# Patient Record
Sex: Female | Born: 1970 | Race: White | Hispanic: No | Marital: Married | Smoking: Never smoker
Health system: Southern US, Community
[De-identification: ages and names within clinical notes are randomized; demographics above are authoritative.]

## PROBLEM LIST (undated history)

## (undated) DIAGNOSIS — M549 Dorsalgia, unspecified: Secondary | ICD-10-CM

## (undated) DIAGNOSIS — M51369 Other intervertebral disc degeneration, lumbar region without mention of lumbar back pain or lower extremity pain: Secondary | ICD-10-CM

## (undated) DIAGNOSIS — R0789 Other chest pain: Secondary | ICD-10-CM

## (undated) DIAGNOSIS — M5136 Other intervertebral disc degeneration, lumbar region: Secondary | ICD-10-CM

## (undated) HISTORY — PX: TONSILLECTOMY: SUR1361

## (undated) HISTORY — PX: APPENDECTOMY: SHX54

## (undated) HISTORY — DX: Other intervertebral disc degeneration, lumbar region: M51.36

## (undated) HISTORY — DX: Other chest pain: R07.89

## (undated) HISTORY — DX: Other intervertebral disc degeneration, lumbar region without mention of lumbar back pain or lower extremity pain: M51.369

---

## 1998-02-15 ENCOUNTER — Other Ambulatory Visit: Admission: RE | Admit: 1998-02-15 | Discharge: 1998-02-15 | Payer: Self-pay | Admitting: Obstetrics and Gynecology

## 1999-05-07 ENCOUNTER — Inpatient Hospital Stay (HOSPITAL_COMMUNITY): Admission: AD | Admit: 1999-05-07 | Discharge: 1999-05-07 | Payer: Self-pay | Admitting: *Deleted

## 1999-05-19 ENCOUNTER — Inpatient Hospital Stay (HOSPITAL_COMMUNITY): Admission: AD | Admit: 1999-05-19 | Discharge: 1999-05-19 | Payer: Self-pay | Admitting: Obstetrics and Gynecology

## 1999-05-19 ENCOUNTER — Observation Stay (HOSPITAL_COMMUNITY): Admission: AD | Admit: 1999-05-19 | Discharge: 1999-05-20 | Payer: Self-pay | Admitting: Obstetrics and Gynecology

## 1999-05-28 ENCOUNTER — Inpatient Hospital Stay (HOSPITAL_COMMUNITY): Admission: AD | Admit: 1999-05-28 | Discharge: 1999-05-30 | Payer: Self-pay | Admitting: Obstetrics and Gynecology

## 1999-05-31 ENCOUNTER — Encounter: Admission: RE | Admit: 1999-05-31 | Discharge: 1999-08-29 | Payer: Self-pay | Admitting: *Deleted

## 2000-07-07 ENCOUNTER — Other Ambulatory Visit: Admission: RE | Admit: 2000-07-07 | Discharge: 2000-07-07 | Payer: Self-pay | Admitting: *Deleted

## 2001-02-23 ENCOUNTER — Other Ambulatory Visit: Admission: RE | Admit: 2001-02-23 | Discharge: 2001-02-23 | Payer: Self-pay | Admitting: Obstetrics and Gynecology

## 2001-09-29 ENCOUNTER — Inpatient Hospital Stay (HOSPITAL_COMMUNITY): Admission: AD | Admit: 2001-09-29 | Discharge: 2001-10-01 | Payer: Self-pay | Admitting: Obstetrics and Gynecology

## 2001-11-08 ENCOUNTER — Other Ambulatory Visit: Admission: RE | Admit: 2001-11-08 | Discharge: 2001-11-08 | Payer: Self-pay | Admitting: Obstetrics and Gynecology

## 2002-12-15 ENCOUNTER — Other Ambulatory Visit: Admission: RE | Admit: 2002-12-15 | Discharge: 2002-12-15 | Payer: Self-pay | Admitting: Obstetrics and Gynecology

## 2009-09-18 ENCOUNTER — Encounter: Admission: RE | Admit: 2009-09-18 | Discharge: 2009-09-18 | Payer: Self-pay | Admitting: Obstetrics and Gynecology

## 2010-05-24 NOTE — H&P (Signed)
   NAME:  Jordan Quinn, Jordan Quinn                        ACCOUNT NO.:  [id number]   MEDICAL RECORD NO.:  [id number]                   PATIENT TYPE:  INP   LOCATION:  9171                                 FACILITY:  [facility]   PHYSICIAN:  Lenoard Aden, M.D.             DATE OF BIRTH:  21-Nov-1970   DATE OF ADMISSION:  09/29/2001  DATE OF DISCHARGE:                                HISTORY & PHYSICAL   CHIEF COMPLAINT:  History of precipitous labor.   HISTORY OF PRESENT ILLNESS:  The patient is a 40 year old white female G5,  P3, EDD October 01, 2001 with a history of precipitous labor.   ALLERGIES:  No known drug allergies.   MEDICATIONS:  Prenatal vitamins.   PAST MEDICAL HISTORY:  History of three spontaneous vaginal deliveries 1996,  1997, and 2001.  History of SAB in August 2002.  Prenatal course  uncomplicated.  History if appendectomy, tonsillectomy, laparoscopy, and  urethral dilatation.   FAMILY HISTORY:  Ovarian cancer, insulin-dependent diabetes, and heart  disease.   PHYSICAL EXAMINATION:  GENERAL:  She is a well-developed, well-nourished  white female in no apparent distress.  HEENT:  Normal.  LUNGS:  Clear.  HEART:  Regular rate and rhythm.  ABDOMEN:  Soft, gravid, and nontender.  Estimated fetal weight 7.5 pounds.  PELVIC:  Cervix is 4 cm, 50%, vertex, -1/-2.  EXTREMITIES:  No cords.  NEUROLOGIC:  Nonfocal.   IMPRESSION:  A 41 week intrauterine pregnancy, history of precipitous labor  with favorable cervix.   PLAN:  AROM induction and attempt at vaginal delivery.                                               Lenoard Aden, M.D.    RJT/MEDQ  D:  09/29/2001  T:  09/29/2001  Job:  [id number]

## 2010-05-24 NOTE — Op Note (Signed)
[facility]  Patient:    Jordan Quinn, Jordan Quinn                     MRN: [id number] Proc. Date: 05/19/99 Adm. Date:  [id number] Disc. Date: [id number] Attending:  Maxie Better                           Operative Report  PREOPERATIVE DIAGNOSIS:       Acute appendicitis.  POSTOPERATIVE DIAGNOSIS:      Acute appendicitis.  OPERATION:                    Appendectomy.  SURGEON:                      Zigmund Daniel, M.D.  ASSISTANT:  ANESTHESIA:                   Epidural.  ESTIMATED BLOOD LOSS:  DESCRIPTION OF PROCEDURE:     After adequate anesthesia and monitoring and routine preparation and draping of the abdomen, I made a short transverse lateral abdominal incision just over the point of maximum tenderness.  I dissected down through the fat and then cut and split the muscles in the direction of the fibers and got hemostasis with the cautery.  I opened the peritoneum sharply and found a small  amount of cloudy fluid present.  I dissected laterally and felt an inflammed appendix and grasped it with a Babcock clamp and elevated it.  The appendicitis was toward the tip with the base of the appendix appearing normal.  I isolated, clamped, and tied the appendiceal artery using 2-0 Vicryl.  I tied off the appendix with two ties of 2-0 Vicryl, amputated it, and cauterized the exposed mucosa of the stump.  The closure appeared very secure.  I copiously irrigated the local area  with saline solution and removed the irrigant.  Bleeding was not a problem.  I closed the wound with running 2-0 Vicryl in the peritoneum and running #1 PDS in the muscle layers.  I irrigated each layer successfully before closure.  I closed the skin with intracuticular 4-0 Vicryl and Steri-Strips.  She tolerated the operation well. DD:  05/19/99 TD:  05/20/99 Job: [id number]

## 2010-05-24 NOTE — Op Note (Signed)
   NAME:  Jordan Quinn, Jordan Quinn                        ACCOUNT NO.:  [id number]   MEDICAL RECORD NO.:  [id number]                   PATIENT TYPE:  INP   LOCATION:  9134                                 FACILITY:  [facility]   PHYSICIAN:  Lenoard Aden, M.D.             DATE OF BIRTH:  [birth year]   DATE OF PROCEDURE:  09/29/2001  DATE OF DISCHARGE:                                 OPERATIVE REPORT   PREOPERATIVE DIAGNOSIS:  Intense low back pain with inability to tolerate  contractions.   POSTOPERATIVE DIAGNOSIS:  Intense low back pain with inability to tolerate  contractions.   SURGEON:  Lenoard Aden, M.D.   PROCEDURE:  Delivery.   DESCRIPTION OF PROCEDURE:  Outlet vacuum assistance due to intense low back  pain and inability to tolerate contractions.  The patient is offered a  Mityvac assistance.  Risks and benefits of vacuum assistance were discussed.  The patient acknowledges and wishes to proceed.  Risks of cephalohematoma,  scalp laceration and intracranial hemorrhage noted. Mityvac mushroom cup  placed at the fetal vertex, RAO less than 45 degrees, +3 station for one  pull over a second degree laceration.  Full term living female, Apgars 8 and  9, bulb suctioning performed.  The placenta delivered spontaneously intact.  Three vessel cord noted.  Cervix without lacerations.  Vagina without  lacerations.  Second degree midline perineal laceration repaired using 3-0  Rapide in a standard fashion.  Estimated blood loss was 500 cc.  Please note  that the bladder was catheterized and emptied for approximately 200 cc prior  to vacuum assistance.  Mother and baby recovering in good condition.                                                Lenoard Aden, M.D.    RJT/MEDQ  D:  09/29/2001  T:  09/29/2001  Job:  [id number]   cc:   [facility]

## 2010-05-24 NOTE — H&P (Signed)
[facility]  Patient:    Jordan Quinn, Jordan Quinn                     MRN: [id number] Adm. Date:  [id number] Attending:  Lenoard Aden                         History and Physical  CHIEF COMPLAINT:              Active labor.  HISTORY OF PRESENT ILLNESS:   The patient is a 40 year old white female, gravida 3, para 2, EDD of May 31, 1999, at 39+ weeks in active labor.  ALLERGIES:                    No known drug allergies.  MEDICATIONS:                  Prenatal vitamins and iron.  PAST MEDICAL HISTORY:         Remarkable for two uncomplicated vaginal deliveries in 1996 and 1997.  Diagnostic laparoscopy in 1995 for left adnexal peritubal cyst. History of reproductive endocrinology and inadatrophin IUI for pregnancy. History of appendectomy eight days ago.  FAMILY HISTORY:               Quadrupal bypass surgery, varicose veins, insulin-dependent diabetes mellitus, Alzheimers disease, and lung cancer.  PAST SURGICAL HISTORY:        She had urethral dilatation at age 56, T&A at age 26.  PRENATAL LABORATORY DATA:     Blood type A positive, rubella immune, hepatitis  surface antigen negative, HIV nonreactive, GC and Chlamydia negative.  PHYSICAL EXAMINATION:  GENERAL:                      A well-developed, well-nourished white female coping well.  LUNGS:                        Clear.  HEART:                        Regular rate and rhythm.  ABDOMEN:                      Soft, gravid, and nontender.  Steri-stripped abdominal incision from appendectomy noted.  Fundus nontender.  Cervix is 9 to 0 cm, 100%, vertex, and 0 to +1.  Artificial rupture of membranes reveals clear fluid.  EXTREMITIES:                  Reveal no cords.  NEUROLOGICAL:                 Nonfocal.  IMPRESSION:                   1. 39-week intrauterine pregnancy in active labor.                               2. Status post appendectomy.                               3.  Group B Strep positive.  PLAN:                         Intrapartum  prophylaxis, Pitocin augmentation if necessary, anticipate vaginal delivery. DD:  05/28/99 TD:  05/28/99 Job: [id number]

## 2010-09-24 ENCOUNTER — Other Ambulatory Visit: Payer: Self-pay | Admitting: Obstetrics and Gynecology

## 2010-09-24 DIAGNOSIS — Z1231 Encounter for screening mammogram for malignant neoplasm of breast: Secondary | ICD-10-CM

## 2010-10-04 ENCOUNTER — Ambulatory Visit
Admission: RE | Admit: 2010-10-04 | Discharge: 2010-10-04 | Disposition: A | Discharge status: Home / Self Care | Payer: BC Managed Care – PPO | Source: Ambulatory Visit | Attending: Obstetrics and Gynecology | Admitting: Obstetrics and Gynecology

## 2010-10-04 DIAGNOSIS — Z1231 Encounter for screening mammogram for malignant neoplasm of breast: Secondary | ICD-10-CM

## 2011-09-26 ENCOUNTER — Other Ambulatory Visit: Payer: Self-pay | Admitting: Obstetrics and Gynecology

## 2011-09-26 DIAGNOSIS — Z1231 Encounter for screening mammogram for malignant neoplasm of breast: Secondary | ICD-10-CM

## 2011-10-17 ENCOUNTER — Ambulatory Visit
Admission: RE | Admit: 2011-10-17 | Discharge: 2011-10-17 | Disposition: A | Discharge status: Home / Self Care | Payer: Self-pay | Source: Ambulatory Visit | Attending: Obstetrics and Gynecology | Admitting: Obstetrics and Gynecology

## 2011-10-17 DIAGNOSIS — Z1231 Encounter for screening mammogram for malignant neoplasm of breast: Secondary | ICD-10-CM

## 2012-05-22 ENCOUNTER — Emergency Department (HOSPITAL_COMMUNITY)
Admission: EM | Admit: 2012-05-22 | Discharge: 2012-05-23 | Disposition: A | Discharge status: Home / Self Care | Payer: BC Managed Care – PPO | Attending: Emergency Medicine | Admitting: Emergency Medicine

## 2012-05-22 ENCOUNTER — Encounter (HOSPITAL_COMMUNITY): Payer: Self-pay | Admitting: *Deleted

## 2012-05-22 ENCOUNTER — Emergency Department (HOSPITAL_COMMUNITY): Payer: BC Managed Care – PPO

## 2012-05-22 DIAGNOSIS — R061 Stridor: Secondary | ICD-10-CM

## 2012-05-22 DIAGNOSIS — Z79899 Other long term (current) drug therapy: Secondary | ICD-10-CM | POA: Insufficient documentation

## 2012-05-22 HISTORY — DX: Dorsalgia, unspecified: M54.9

## 2012-05-22 MED ORDER — DEXAMETHASONE SODIUM PHOSPHATE 10 MG/ML IJ SOLN
10.0000 mg | Freq: Once | INTRAMUSCULAR | Status: AC
  Administered 2012-05-22: 10 mg via INTRAVENOUS
  Filled 2012-05-22: qty 1

## 2012-05-22 MED ORDER — DIPHENHYDRAMINE HCL 50 MG/ML IJ SOLN
25.0000 mg | Freq: Once | INTRAMUSCULAR | Status: AC
  Administered 2012-05-22: 25 mg via INTRAVENOUS
  Filled 2012-05-22: qty 1

## 2012-05-22 MED ORDER — RACEPINEPHRINE HCL 2.25 % IN NEBU
0.5000 mL | INHALATION_SOLUTION | Freq: Once | RESPIRATORY_TRACT | Status: AC
  Administered 2012-05-22: 0.5 mL via RESPIRATORY_TRACT
  Filled 2012-05-22: qty 0.5

## 2012-05-22 NOTE — ED Provider Notes (Signed)
History     CSN: [id number]  Arrival date & time 05/22/12  2016   First MD Initiated Contact with Patient 05/22/12 2047      Chief Complaint  Patient presents with  . Shortness of Breath    (Consider location/radiation/quality/duration/timing/severity/associated sxs/prior treatment) HPI Comments: Jordan Quinn is a 42 y.o. female presents emergency department complaining of shortness of breath and voice change.  Onset of symptoms began approximately 2 hours ago while patient was resting in bed.  No known precipitating events occurred.  Patient describes as though she feels something is in her throat.  Symptoms have been gradually improving since onset. Patient has history of amoxicillin urticarial reaction but no other known allergies.  Patient has not started any new medications or products.  Patient reports that 3 weeks ago she had a concussion from a fly ball at a baseball game and wonders if this may be the cause.  Approximately one week ago patient had a similar occurrence, however this time it only involved the voice change without difficulty breathing.  No known fevers, cough, night sweats, chills, chest pain.  Patient is a 42 y.o. female presenting with shortness of breath. The history is provided by the patient.  Shortness of Breath   Past Medical History  Diagnosis Date  . Back pain     Past Surgical History  Procedure Laterality Date  . Appendectomy    . Tonsillectomy      History reviewed. No pertinent family history.  History  Substance Use Topics  . Smoking status: Not on file  . Smokeless tobacco: Not on file  . Alcohol Use: No    OB History   Grav Para Term Preterm Abortions TAB SAB Ect Mult Living                  Review of Systems  Respiratory: Positive for shortness of breath.   All other systems reviewed and are negative.    Allergies  Amoxicillin  Home Medications   Current Outpatient Rx  Name  Route  Sig  Dispense  Refill  .  ibuprofen (ADVIL,MOTRIN) 200 MG tablet   Oral   Take 400 mg by mouth once.         . loratadine (CLARITIN) 10 MG tablet   Oral   Take 10 mg by mouth once.         Marland Kitchen oxyCODONE-acetaminophen (PERCOCET) 10-325 MG per tablet   Oral   Take 0.5 tablets by mouth every 4 (four) hours as needed for pain.           BP 113/58  Pulse 82  Temp(Src) 98.1 F (36.7 C) (Oral)  Resp 18  SpO2 100%  LMP 05/04/2012  Physical Exam  Nursing note and vitals reviewed. Constitutional: She is oriented to person, place, and time. She appears well-developed and well-nourished. She appears distressed.  HENT:  Head: Normocephalic and atraumatic.  MMM, airway intact,  oropharynx clear and moist without exudate.    Eyes: Conjunctivae and EOM are normal.  Neck: Normal range of motion. Neck supple. No JVD present.  No cervical adenopathy  Cardiovascular:  Regular rate rhythm.  Intact distal pulses  Pulmonary/Chest: Effort normal. Stridor present. She has no wheezes. She exhibits no tenderness.  Lungs clear to auscultation bilaterally  Musculoskeletal: Normal range of motion.  Neurological: She is alert and oriented to person, place, and time.  Skin: Skin is warm and dry. No rash noted. She is not diaphoretic.  Psychiatric: She has  a normal mood and affect. Her behavior is normal.    ED Course  Procedures (including critical care time)  Labs Reviewed - No data to display No results found.   No diagnosis found.  Medications  dexamethasone (DECADRON) injection 10 mg (not administered)  diphenhydrAMINE (BENADRYL) injection 25 mg (25 mg Intravenous Given 05/22/12 2145)  Racepinephrine HCl 2.25 % nebulizer solution 0.5 mL (0.5 mLs Nebulization Given 05/22/12 2147)     MDM  Patient to emergency department with acute onset of voice change & stridor or, no known precipitating events. Low suspicion for allergic reaction as pt denies throat closing sensation, is normotensive and presents without  urticaria, angioedema or pruritis.  Normal vital signs throughout hospital stay.  Significant improvement with racemic epinephrine and Benadryl.  Decadron given for longer acting steroid relief for possible inflammation of unknown cause.  Referral to ENT as well as extremely strict return precautions discussed in depth with both patient and wife.  Patient case seen with attending who agrees with disposition plan.        Jaci Carrel, [place] 05/22/12 2329

## 2012-05-22 NOTE — ED Provider Notes (Signed)
Medical screening examination/treatment/procedure(s) were conducted as a shared visit with non-physician practitioner(s) and myself.  I personally evaluated the patient during the encounter  Pt with second occurrence of rather sudden change in voice, today had stridor, mild, no apparent resp distress, no fever.  Soft tissue neck is unremarkable.  Pt improved with bendaryl, racemic epi.  Pt observed in the ED and pt remains significantly improved.  Will put on decadron and refer to ENT for follow up.  Pt understands to return immediately for any worsening symptoms.    Gavin Pound. Oletta Lamas, MD 05/22/12 2324

## 2012-05-22 NOTE — ED Notes (Signed)
Pt states 2 hours ago she was sitting on her and became sob and change in voice,  Hands feel tingling,  States she had a head injury two weeks ago

## 2012-06-02 ENCOUNTER — Other Ambulatory Visit: Payer: Self-pay | Admitting: Physical Medicine and Rehabilitation

## 2012-06-02 DIAGNOSIS — S060X0A Concussion without loss of consciousness, initial encounter: Secondary | ICD-10-CM

## 2012-06-03 ENCOUNTER — Ambulatory Visit
Admission: RE | Admit: 2012-06-03 | Discharge: 2012-06-03 | Disposition: A | Discharge status: Home / Self Care | Payer: BC Managed Care – PPO | Source: Ambulatory Visit | Attending: Physical Medicine and Rehabilitation | Admitting: Physical Medicine and Rehabilitation

## 2012-06-03 DIAGNOSIS — S060X0A Concussion without loss of consciousness, initial encounter: Secondary | ICD-10-CM

## 2012-06-07 ENCOUNTER — Other Ambulatory Visit: Payer: BC Managed Care – PPO

## 2012-06-17 ENCOUNTER — Ambulatory Visit: Payer: BC Managed Care – PPO | Admitting: Neurology

## 2012-06-17 ENCOUNTER — Encounter: Payer: Self-pay | Admitting: Neurology

## 2012-06-18 ENCOUNTER — Encounter: Payer: Self-pay | Admitting: Neurology

## 2012-06-18 ENCOUNTER — Ambulatory Visit (INDEPENDENT_AMBULATORY_CARE_PROVIDER_SITE_OTHER): Payer: BC Managed Care – PPO | Admitting: Neurology

## 2012-06-18 VITALS — BP 102/60 | HR 84 | Ht 64.5 in | Wt 110.0 lb

## 2012-06-18 DIAGNOSIS — S0990XA Unspecified injury of head, initial encounter: Secondary | ICD-10-CM

## 2012-06-18 DIAGNOSIS — G43909 Migraine, unspecified, not intractable, without status migrainosus: Secondary | ICD-10-CM

## 2012-06-18 NOTE — Progress Notes (Signed)
History of present illness:  Jordan Quinn is a 42 years old right-handed Caucasian female, referred by her pain management doctor Remos for evaluation of frequent headaches  She is under Dr. Alger Simons care for chronic upper back, low back pain, she is taking Percocet 10/325 mg half tablets every 4 hours.  She suffered a baseball head injury in April 27th 2014, while watching her son playing baseball play, no loss of consciousness, but did has bruits at her left frontal area   Later days, she complains of intermittent confusion, agitation, bursting into tears, do not know how to handle her excel spread sheet, feeling scared,memory loss,  difficulty carrying on a conversation   In addition she has almost daily headaches, at left retro-orbital area, left occipital region, pressure, pounding, with associated light noise sensitivity, nauseous,   Overall her symptoms has 80% improvement, Percocet helps her headache  She denied previous history of migraine headaches, no consistent visual loss, she reported normal CAT scan of the brain without contrast 2 weeks ago.  Review of Systems  Out of a complete 14 system review, the patient complains of only the following symptoms, and all other reviewed systems are negative.   Constitutional:   N/A Cardiovascular:  N/A Ear/Nose/Throat:  N/A Skin: N/A Eyes: N/A Respiratory: N/A Gastroitestinal: N/A    Hematology/Lymphatic:  N/A Endocrine:  N/A Musculoskeletal:N/A Allergy/Immunology: allergy Neurological: memory loss, confusion, headache, dizziness Psychiatric:    N/A  PHYSICAL EXAMINATOINS:  Generalized: In no acute distress  Neck: Supple, no carotid bruits   Cardiac: Regular rate rhythm  Pulmonary: Clear to auscultation bilaterally  Musculoskeletal: No deformity  Neurological examination  Mentation: Alert oriented to time, place, history taking, and causual conversation  Cranial nerve II-XII: Pupils were equal round reactive to light  extraocular movements were full, visual field were full on confrontational test. facial sensation and strength were normal. hearing was intact to finger rubbing bilaterally. Uvula tongue midline.  head turning and shoulder shrug and were normal and symmetric.Tongue protrusion into cheek strength was normal.  Motor: normal tone, bulk and strength.  Sensory: Intact to fine touch, pinprick, preserved vibratory sensation, and proprioception at toes.  Coordination: Normal finger to nose, heel-to-shin bilaterally there was no truncal ataxia  Gait: Rising up from seated position without assistance, normal stance, without trunk ataxia, moderate stride, good arm swing, smooth turning, able to perform tiptoe, and heel walking without difficulty.   Romberg signs: Negative  Deep tendon reflexes: Brachioradialis 2/2, biceps 2/2, triceps 2/2, patellar 2/2, Achilles 2/2, plantar responses were flexor bilaterally.   Assessment and plan,  42 years old Caucasian female, with history of head trauma induced a mild migraine headaches, normal neurological examination, normal CAT scan of the brain 1. Magnesium oxide, riboflavin as preventive medications, 2. Percocet as needed. 3. RTC with Eber Jones in 3 months

## 2012-09-21 ENCOUNTER — Ambulatory Visit: Payer: BC Managed Care – PPO | Admitting: Nurse Practitioner

## 2012-10-06 ENCOUNTER — Other Ambulatory Visit: Payer: Self-pay

## 2012-10-06 DIAGNOSIS — Z1231 Encounter for screening mammogram for malignant neoplasm of breast: Secondary | ICD-10-CM

## 2012-11-05 ENCOUNTER — Ambulatory Visit
Admission: RE | Admit: 2012-11-05 | Discharge: 2012-11-05 | Disposition: A | Discharge status: Home / Self Care | Payer: BC Managed Care – PPO | Source: Ambulatory Visit

## 2012-11-05 DIAGNOSIS — Z1231 Encounter for screening mammogram for malignant neoplasm of breast: Secondary | ICD-10-CM

## 2014-03-28 IMAGING — CT CT HEAD W/O CM
2 series · 16 of 30 positions shown, 20 images · non-contrast
Comparison: None.

CLINICAL DATA: Headaches, dizziness

CT HEAD WITHOUT CONTRAST
TECHNIQUE: Contiguous axial images were obtained from the base of
the skull through the vertex without contrast.

[Series 2: head w/o · axial · non-contrast · 0.49mm/px · z∈[+39,+167]mm · 13 of 28 slices shown, 17 images]
[im 2/28  brain]
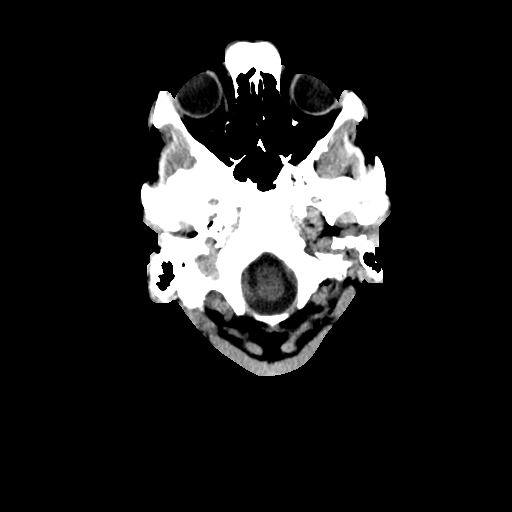
[im 2/28  bone]
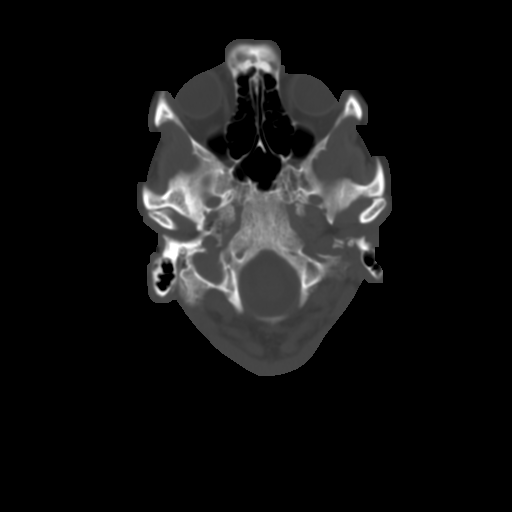
[im 4/28  brain]
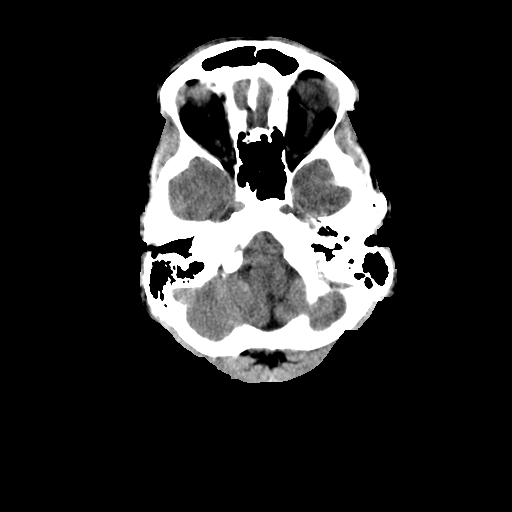
[im 6/28  brain]
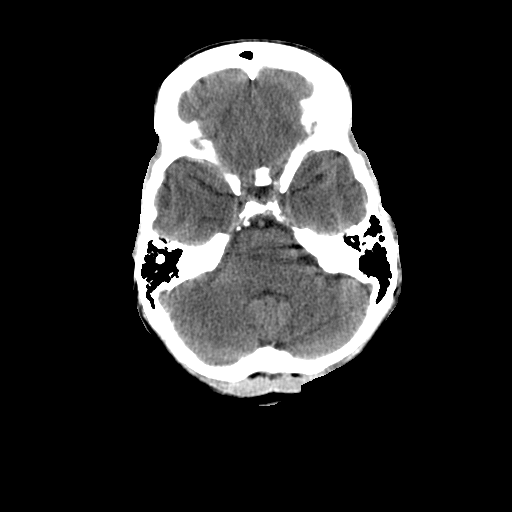
[im 8/28  brain]
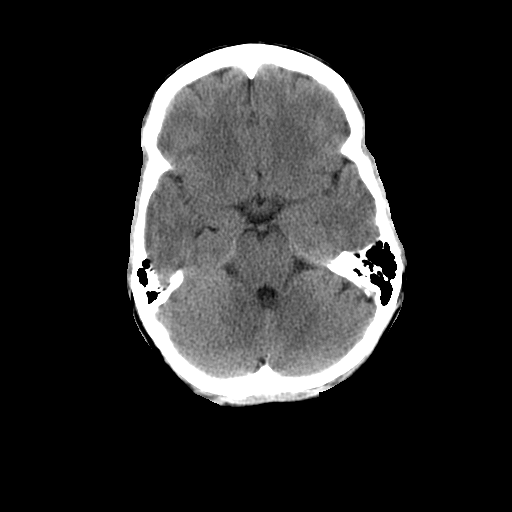
[im 10/28  brain]
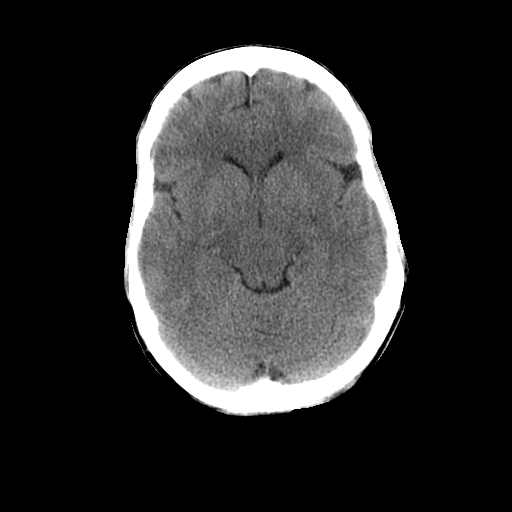
[im 10/28  bone]
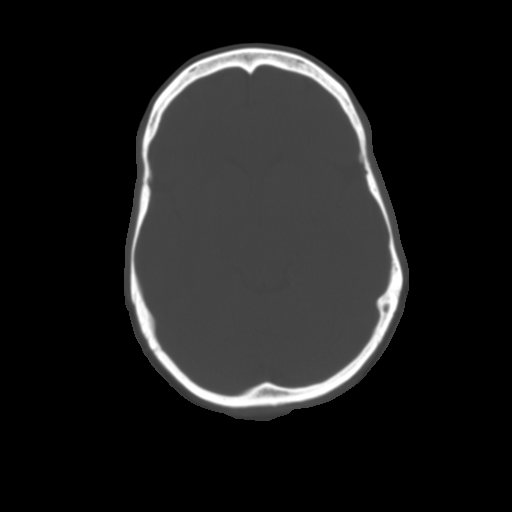
[im 12/28  brain]
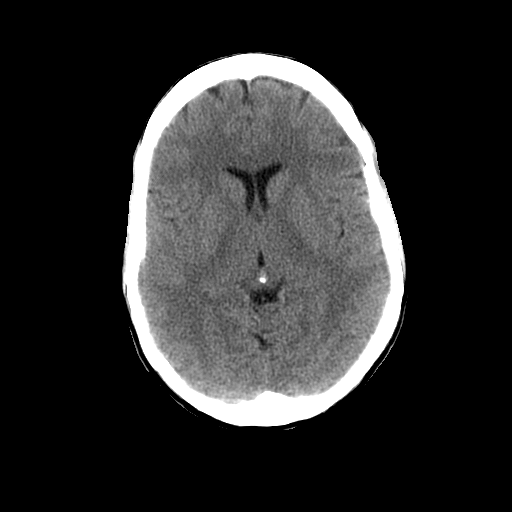
[im 14/28  brain]
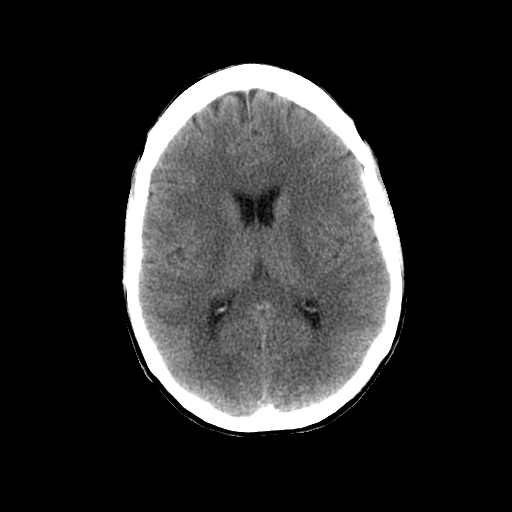
[im 16/28  brain]
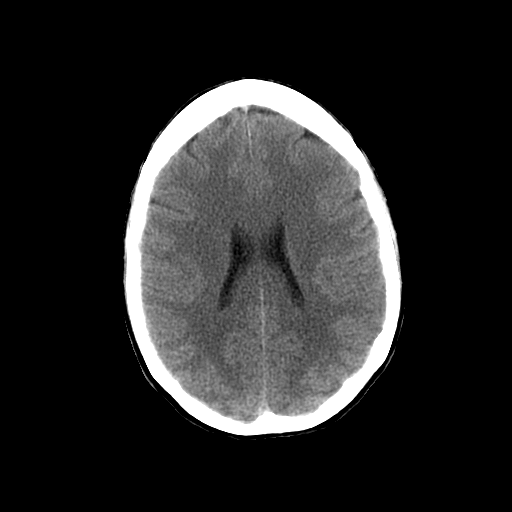
[im 18/28  brain]
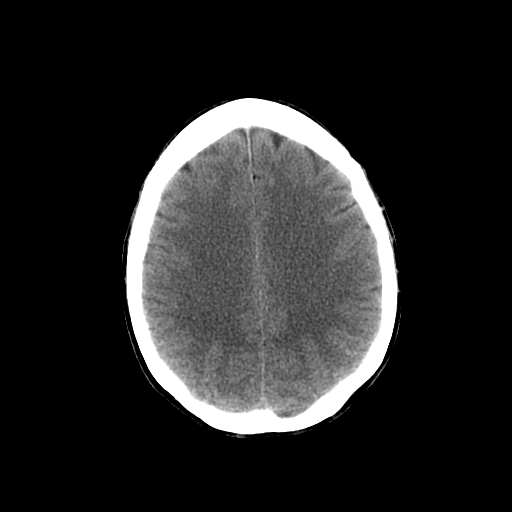
[im 18/28  bone]
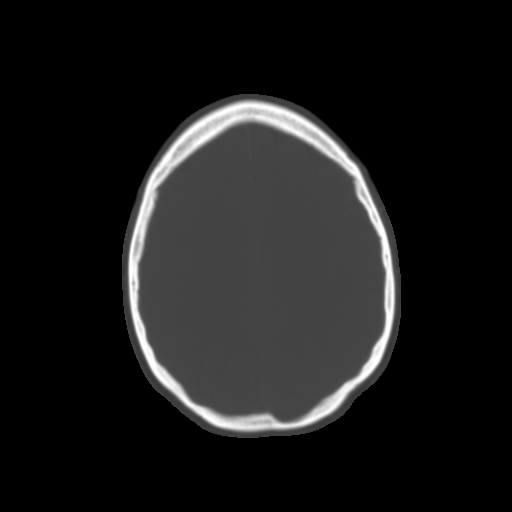
[im 20/28  brain]
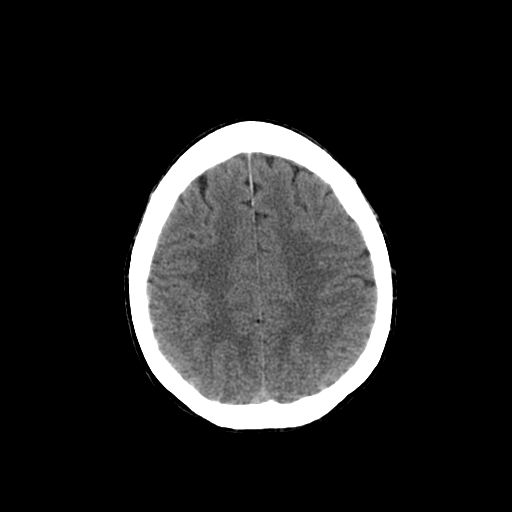
[im 22/28  brain]
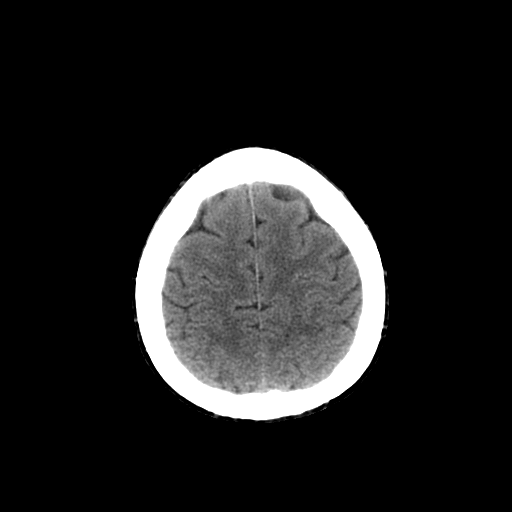
[im 24/28  brain]
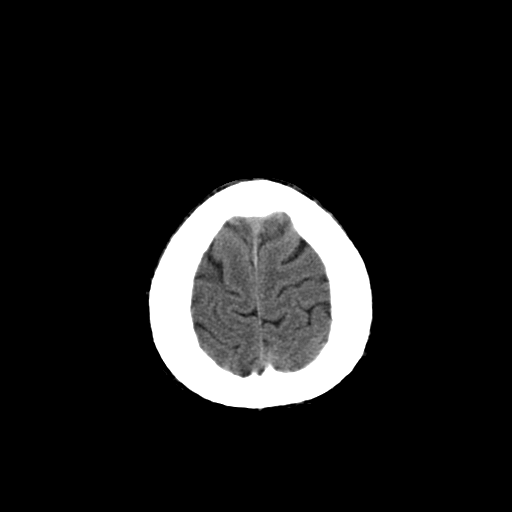
[im 26/28  brain]
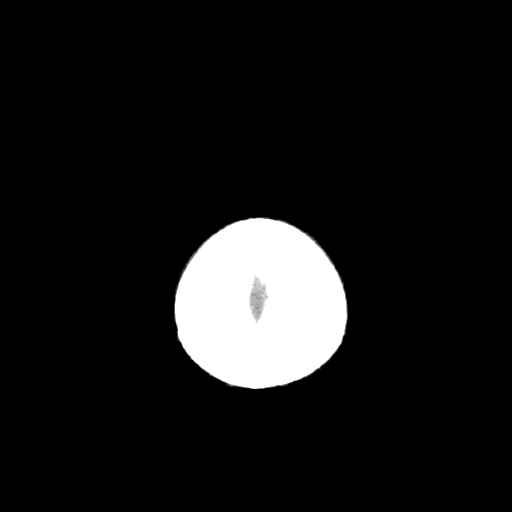
[im 26/28  bone]
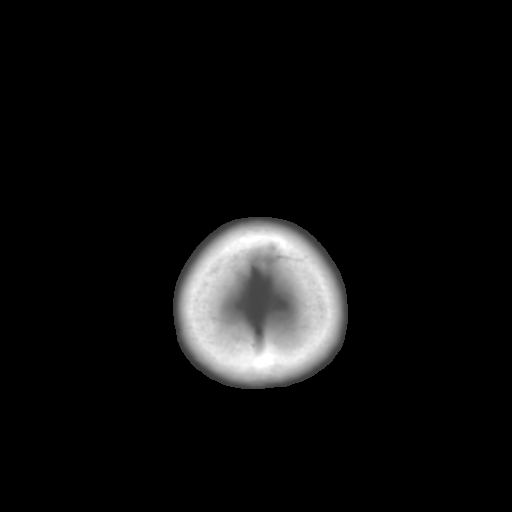

[Series 3: head bone · axial · 0.49mm/px · z∈[+39,+81]mm · 3 of 28 slices shown]
[im 2/28  bone]
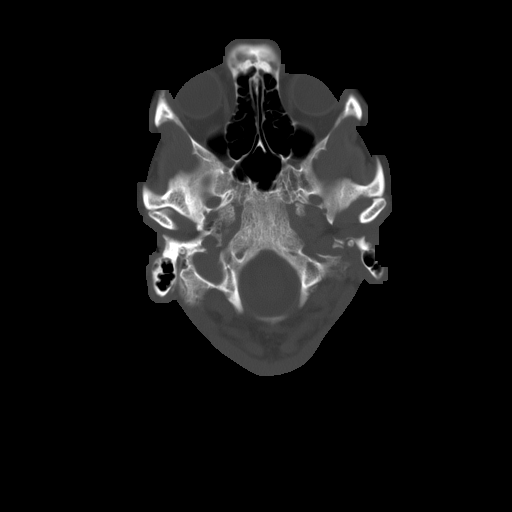
[im 6/28  bone]
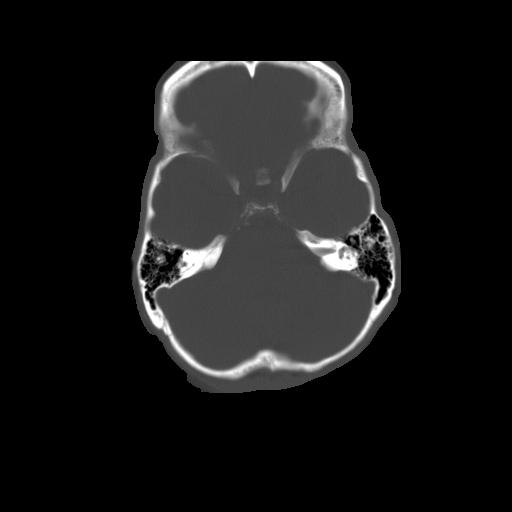
[im 10/28  bone]
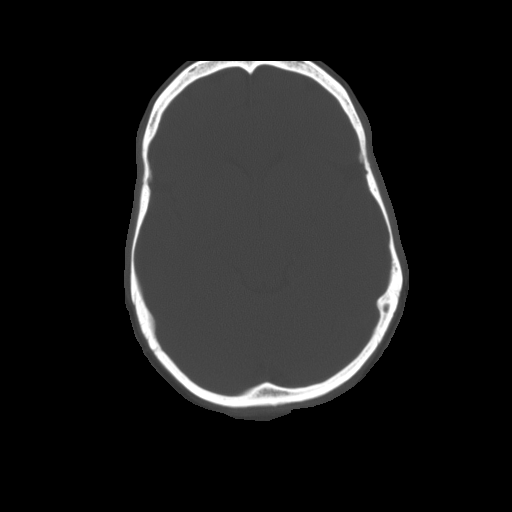

[16 of 30 positions shown; findings below may reference images not displayed]

FINDINGS: There is no evidence of acute intracranial hemorrhage,
brain edema, mass lesion, acute infarction,   mass effect, or
midline shift. Acute infarct may be inapparent on noncontrast CT.
No other intra-axial abnormalities are seen, and the ventricles and
sulci are within normal limits in size and symmetry.   No abnormal
extra-axial fluid collections or masses are identified.  No
significant calvarial abnormality.
IMPRESSION: 1. Negative for bleed or other acute intracranial process.

## 2014-08-25 ENCOUNTER — Emergency Department
Admission: EM | Admit: 2014-08-25 | Discharge: 2014-08-26 | Disposition: A | Discharge status: Home / Self Care | Payer: 59 | Attending: Emergency Medicine | Admitting: Emergency Medicine

## 2014-08-25 ENCOUNTER — Encounter: Payer: Self-pay | Admitting: Emergency Medicine

## 2014-08-25 ENCOUNTER — Other Ambulatory Visit: Payer: Self-pay

## 2014-08-25 ENCOUNTER — Emergency Department: Payer: 59

## 2014-08-25 DIAGNOSIS — Z880 Allergy status to penicillin: Secondary | ICD-10-CM | POA: Diagnosis not present

## 2014-08-25 DIAGNOSIS — R079 Chest pain, unspecified: Secondary | ICD-10-CM | POA: Diagnosis not present

## 2014-08-25 DIAGNOSIS — Z79899 Other long term (current) drug therapy: Secondary | ICD-10-CM | POA: Diagnosis not present

## 2014-08-25 DIAGNOSIS — F419 Anxiety disorder, unspecified: Secondary | ICD-10-CM | POA: Diagnosis not present

## 2014-08-25 LAB — COMPREHENSIVE METABOLIC PANEL
ALT: 16 U/L (ref 14–54)
ANION GAP: 7 (ref 5–15)
AST: 23 U/L (ref 15–41)
Albumin: 4.6 g/dL (ref 3.5–5.0)
Alkaline Phosphatase: 30 U/L — ABNORMAL LOW (ref 38–126)
BILIRUBIN TOTAL: 0.2 mg/dL — AB (ref 0.3–1.2)
BUN: 11 mg/dL (ref 6–20)
CO2: 26 mmol/L (ref 22–32)
Calcium: 9.4 mg/dL (ref 8.9–10.3)
Chloride: 105 mmol/L (ref 101–111)
Creatinine, Ser: 0.63 mg/dL (ref 0.44–1.00)
Glucose, Bld: 103 mg/dL — ABNORMAL HIGH (ref 65–99)
POTASSIUM: 3.4 mmol/L — AB (ref 3.5–5.1)
Sodium: 138 mmol/L (ref 135–145)
TOTAL PROTEIN: 6.9 g/dL (ref 6.5–8.1)

## 2014-08-25 LAB — CBC
HEMATOCRIT: 34.9 % — AB (ref 35.0–47.0)
Hemoglobin: 12 g/dL (ref 12.0–16.0)
MCH: 30.5 pg (ref 26.0–34.0)
MCHC: 34.5 g/dL (ref 32.0–36.0)
MCV: 88.3 fL (ref 80.0–100.0)
Platelets: 233 10*3/uL (ref 150–440)
RBC: 3.95 MIL/uL (ref 3.80–5.20)
RDW: 12.7 % (ref 11.5–14.5)
WBC: 7.9 10*3/uL (ref 3.6–11.0)

## 2014-08-25 LAB — TROPONIN I: Troponin I: <0.03 ng/mL (ref <0.031)

## 2014-08-25 MED ORDER — LORAZEPAM 1 MG PO TABS
ORAL_TABLET | ORAL | Status: AC
  Administered 2014-08-25: 1 mg via ORAL
  Filled 2014-08-25: qty 1

## 2014-08-25 MED ORDER — LORAZEPAM 1 MG PO TABS
1.0000 mg | ORAL_TABLET | Freq: Once | ORAL | Status: AC
  Administered 2014-08-25: 1 mg via ORAL

## 2014-08-25 NOTE — ED Provider Notes (Signed)
[facility] Emergency Department Provider Note  ____________________________________________  Time seen: 10 PM  I have reviewed the triage vital signs and the nursing notes.   HISTORY  Chief Complaint Chest Pain    HPI Jordan Quinn is a 44 y.o. female who presents with complaints of chest pain. This started approximately an hour and a half prior to arrival. She notes that she has been under a lot of stress with her children returning to school and she has a history of anxiety which has caused her to have tightness in her chest in the past. She had tingling in both of her fingers during the event. Currently she feels well and has no chest pain. No fevers or chills. No shortness of breath. No recent travel.She has no history of coronary artery disease     Past Medical History  Diagnosis Date  . Back pain   . Lumbar degenerative disc disease     Patient Active Problem List   Diagnosis Date Noted  . Head trauma 06/18/2012  . Migraine, unspecified, without mention of intractable migraine without mention of status migrainosus 06/18/2012    Past Surgical History  Procedure Laterality Date  . Appendectomy    . Tonsillectomy      Current Outpatient Rx  Name  Route  Sig  Dispense  Refill  . fexofenadine (ALLEGRA) 180 MG tablet   Oral   Take 180 mg by mouth daily.         Marland Kitchen oxyCODONE-acetaminophen (PERCOCET) 10-325 MG per tablet   Oral   Take 0.5 tablets by mouth every 4 (four) hours as needed for pain.         Marland Kitchen zolpidem (AMBIEN) 5 MG tablet   Oral   Take 5 mg by mouth at bedtime as needed for sleep.           Allergies Ibuprofen and Amoxicillin  Family History  Problem Relation Age of Onset  . High Cholesterol Mother   . Hypertension Mother   . Osteoporosis Father   . Cancer - Lung Maternal Grandfather   . Cancer - Ovarian Paternal Grandmother     Social History Social History  Substance Use Topics  . Smoking status: Never  Smoker   . Smokeless tobacco: Never Used  . Alcohol Use: No    Review of Systems  Constitutional: Negative for fever. Eyes: Negative for visual changes. ENT: Negative for sore throat Cardiovascular: Positive for chest pain Respiratory: Negative for shortness of breath. Gastrointestinal: Negative for abdominal pain, vomiting and diarrhea. Genitourinary: Negative for dysuria. Musculoskeletal: Negative for back pain. Skin: Negative for rash. Neurological: Negative for headaches . Positive for dizziness Psychiatric: Mild anxiety    ____________________________________________   PHYSICAL EXAM:  VITAL SIGNS: ED Triage Vitals  Enc Vitals Group     BP 08/25/14 2107 120/71 mmHg     Pulse Rate 08/25/14 2107 69     Resp 08/25/14 2107 20     Temp 08/25/14 2107 98.8 F (37.1 C)     Temp Source 08/25/14 2107 Oral     SpO2 08/25/14 2107 100 %     Weight 08/25/14 2107 110 lb (49.896 kg)     Height 08/25/14 2107  (1.651 m)     Head Cir --      Peak Flow --      Pain Score --      Pain Loc --      Pain Edu? --      Excl.  in GC? --      Constitutional: Alert and oriented. Well appearing and in no distress. Eyes: Conjunctivae are normal.  ENT   Head: Normocephalic and atraumatic.   Mouth/Throat: Mucous membranes are moist. Cardiovascular: Normal rate, regular rhythm. Normal and symmetric distal pulses are present in all extremities. No murmurs, rubs, or gallops. Respiratory: Normal respiratory effort without tachypnea nor retractions. Breath sounds are clear and equal bilaterally.  Gastrointestinal: Soft and non-tender in all quadrants. No distention. There is no CVA tenderness. Genitourinary: deferred Musculoskeletal: Nontender with normal range of motion in all extremities. No lower extremity tenderness nor edema. Neurologic:  Normal speech and language. No gross focal neurologic deficits are appreciated. Skin:  Skin is warm, dry and intact. No rash  noted. Psychiatric: Mood and affect are normal. Patient exhibits appropriate insight and judgment.  ____________________________________________    LABS (pertinent positives/negatives)  Labs Reviewed  CBC - Abnormal; Notable for the following:    HCT 34.9 (*)    All other components within normal limits  COMPREHENSIVE METABOLIC PANEL - Abnormal; Notable for the following:    Potassium 3.4 (*)    Glucose, Bld 103 (*)    Alkaline Phosphatase 30 (*)    Total Bilirubin 0.2 (*)    All other components within normal limits  TROPONIN I  TROPONIN I    ____________________________________________   EKG  ED ECG REPORT I, Jene Every, the attending physician, personally viewed and interpreted this ECG.  Date: 08/25/2014 EKG Time: 9 PM Rate: 75 Rhythm: normal sinus rhythm QRS Axis: normal Intervals: normal ST/T Wave abnormalities: normal Conduction Disutrbances: none Narrative Interpretation: unremarkable   ____________________________________________    RADIOLOGY I have personally reviewed any xrays that were ordered on this patient: Chest x-ray is unremarkable  ____________________________________________   PROCEDURES  Procedure(s) performed: none  Critical Care performed: none  ____________________________________________   INITIAL IMPRESSION / ASSESSMENT AND PLAN / ED COURSE  Pertinent labs & imaging results that were available during my care of the patient were reviewed by me and considered in my medical decision making (see chart for details).  Patient's history of present illness sounds more consistent with anxiety reaction as opposed ACS. Her initial troponin is normal. She is perc negative We will do a second troponin and if normal the patient follow-up with her physician. Patient is calm and interactive now.  ____________________________________________   FINAL CLINICAL IMPRESSION(S) / ED DIAGNOSES  Final diagnoses:  Chest pain, unspecified  chest pain type     Jene Every, MD 08/27/14 0004

## 2014-08-25 NOTE — Discharge Instructions (Signed)

## 2014-08-25 NOTE — ED Notes (Signed)
Pt states onset of left sided chest pain that began approx one hour pta. Pt deniesnausea, states has shob, states had tingling to left fingers. Pt is anxious. Pt denies pain currently. Pt denies diaphoresis.

## 2014-08-26 MED ORDER — LORAZEPAM 0.5 MG PO TABS: 0.5000 mg | ORAL_TABLET | Freq: Three times a day (TID) | ORAL | Status: AC | PRN

## 2014-08-26 NOTE — ED Notes (Signed)
Patient with no complaints at this time. Respirations even and unlabored. Skin warm/dry. Discharge instructions reviewed with patient at this time. Patient given opportunity to voice concerns/ask questions. Patient discharged at this time and left Emergency Department with steady gait.   

## 2014-08-30 ENCOUNTER — Telehealth: Payer: Self-pay

## 2014-08-30 NOTE — Telephone Encounter (Signed)
l mom to schedule ED f/u 

## 2014-09-26 ENCOUNTER — Ambulatory Visit (INDEPENDENT_AMBULATORY_CARE_PROVIDER_SITE_OTHER): Payer: 59 | Admitting: Cardiovascular Disease

## 2014-09-26 ENCOUNTER — Encounter: Payer: Self-pay | Admitting: Cardiovascular Disease

## 2014-09-26 VITALS — BP 98/62 | HR 67 | Ht 65.0 in | Wt 113.4 lb

## 2014-09-26 DIAGNOSIS — R0789 Other chest pain: Secondary | ICD-10-CM | POA: Diagnosis not present

## 2014-09-26 DIAGNOSIS — R079 Chest pain, unspecified: Secondary | ICD-10-CM

## 2014-09-26 NOTE — Patient Instructions (Signed)
Medication Instructions:  Your physician recommends that you continue on your current medications as directed. Please refer to the Current Medication list given to you today.   Labwork: NONE  Testing/Procedures: NONE  Follow-Up: Follow up with Dr. Berry as needed.  Any Other Special Instructions Will Be Listed Below (If Applicable).   

## 2014-09-26 NOTE — Assessment & Plan Note (Signed)
Jordan Quinn is a delightful 44 year old female referred for referred by [facility] emergency room for evaluation of atypical chest pain. He has no cardiovascular risk factors. She is under a lot of stress. Her symptoms sounded more like hyperventilation. Her EKG showed an RV conduction delay but otherwise no acute changes and her enzymes were negative. She exercises on a daily basis. I do not think her pain was cardiovascular in nature. No further workup is required at this time

## 2014-09-26 NOTE — Progress Notes (Signed)
09/26/2014 Jordan Quinn   [birth year]  [id number]  Primary Physician No PCP Per Patient Primary Cardiologist: Runell Gess MD Roseanne Reno   HPI:  Jordan Quinn is a delightful 44 year old thin and fit-appearing married Caucasian female mother of 4 children who does contract work for the [employer] and also is present at the [employer]. She was referred by [facility] emergency room for cardiovascular evaluation because of an episode of atypical chest pain. Her OB/GYN is Dr. Rosemary Holms . She has no cardiac vascular risk factors. She's never had a heart attack or stroke. She denies chest pain or shortness of breath. She exercises on a daily basis. She does admit to being under a lot of stress. She had an episode of dizziness, tingling in her hands and feet atypical chest pain which prompted an ER visit on the way from [facility] where she was seen at [facility]. Workup was negative. She's had no recurrent symptoms.   Current Outpatient Prescriptions  Medication Sig Dispense Refill  . fexofenadine (ALLEGRA) 180 MG tablet Take 180 mg by mouth daily.    Marland Kitchen LORazepam (ATIVAN) 0.5 MG tablet Take 1 tablet (0.5 mg total) by mouth every 8 (eight) hours as needed for anxiety. 20 tablet 0  . oxyCODONE-acetaminophen (PERCOCET) 10-325 MG per tablet Take 0.5 tablets by mouth every 4 (four) hours as needed for pain.    Marland Kitchen zolpidem (AMBIEN) 5 MG tablet Take 5 mg by mouth at bedtime as needed for sleep.     No current facility-administered medications for this visit.    Allergies  Allergen Reactions  . Ibuprofen Shortness Of Breath  . Amoxicillin Hives, Itching and Rash    Social History   Social History  . Marital Status: Married    Spouse Name: N/A  . Number of Children: 4  . Years of Education: 18   Occupational History  . Not on file.   Social History Main Topics  . Smoking status: Never Smoker   . Smokeless tobacco: Never Used  . Alcohol Use: No  . Drug Use: No   . Sexual Activity: Not on file   Other Topics Concern  . Not on file   Social History Narrative     Review of Systems: General: negative for chills, fever, night sweats or weight changes.  Cardiovascular: negative for chest pain, dyspnea on exertion, edema, orthopnea, palpitations, paroxysmal nocturnal dyspnea or shortness of breath Dermatological: negative for rash Respiratory: negative for cough or wheezing Urologic: negative for hematuria Abdominal: negative for nausea, vomiting, diarrhea, bright red blood per rectum, melena, or hematemesis Neurologic: negative for visual changes, syncope, or dizziness All other systems reviewed and are otherwise negative except as noted above.    Blood pressure 98/62, pulse 67, height  (1.651 m), weight 113 lb 6.4 oz (51.438 kg).  General appearance: alert and no distress Neck: no adenopathy, no carotid bruit, no JVD, supple, symmetrical, trachea midline and thyroid not enlarged, symmetric, no tenderness/mass/nodules Lungs: clear to auscultation bilaterally Heart: regular rate and rhythm, S1, S2 normal, no murmur, click, rub or gallop Extremities: extremities normal, atraumatic, no cyanosis or edema Pulses: 2+ and symmetric Skin: Skin color, texture, turgor normal. No rashes or lesions Neurologic: Grossly normal  EKG normal sinus rhythm at 67 without ST or T-wave changes. I personally reviewed this EKG  ASSESSMENT AND PLAN:   Atypical chest pain Jordan Quinn is a delightful 44 year old female referred for referred by [facility] emergency room for  evaluation of atypical chest pain. He has no cardiovascular risk factors. She is under a lot of stress. Her symptoms sounded more like hyperventilation. Her EKG showed an RV conduction delay but otherwise no acute changes and her enzymes were negative. She exercises on a daily basis. I do not think her pain was cardiovascular in nature. No further workup is required at this time      Runell Gess MD [facility], [facility] 09/26/2014 11:48 AM

## 2014-10-04 NOTE — Addendum Note (Signed)
Addended by: Chana Bode on: 10/04/2014 07:48 AM   Modules accepted: Orders

## 2014-10-11 ENCOUNTER — Ambulatory Visit: Payer: 59 | Admitting: Cardiovascular Disease

## 2014-10-24 NOTE — Addendum Note (Signed)
Addended by: Neta EhlersRUITT, TRUE Shackleford M on: 10/24/2014 01:02 PM   Modules accepted: Orders

## 2015-06-06 ENCOUNTER — Other Ambulatory Visit: Payer: Self-pay | Admitting: Obstetrics and Gynecology

## 2015-06-06 DIAGNOSIS — R928 Other abnormal and inconclusive findings on diagnostic imaging of breast: Secondary | ICD-10-CM

## 2015-06-06 DIAGNOSIS — N6489 Other specified disorders of breast: Secondary | ICD-10-CM

## 2015-06-12 ENCOUNTER — Ambulatory Visit
Admission: RE | Admit: 2015-06-12 | Discharge: 2015-06-12 | Disposition: A | Discharge status: Home / Self Care | Payer: Managed Care, Other (non HMO) | Source: Ambulatory Visit | Attending: Obstetrics and Gynecology | Admitting: Obstetrics and Gynecology

## 2015-06-12 DIAGNOSIS — R928 Other abnormal and inconclusive findings on diagnostic imaging of breast: Secondary | ICD-10-CM

## 2015-12-18 ENCOUNTER — Other Ambulatory Visit: Payer: Self-pay | Admitting: Neurology

## 2015-12-18 DIAGNOSIS — G4452 New daily persistent headache (NDPH): Secondary | ICD-10-CM

## 2015-12-22 ENCOUNTER — Ambulatory Visit
Admission: RE | Admit: 2015-12-22 | Discharge: 2015-12-22 | Disposition: A | Discharge status: Home / Self Care | Payer: 59 | Source: Ambulatory Visit | Attending: Neurology | Admitting: Neurology

## 2015-12-22 DIAGNOSIS — G4452 New daily persistent headache (NDPH): Secondary | ICD-10-CM

## 2016-04-17 ENCOUNTER — Other Ambulatory Visit: Payer: Self-pay | Admitting: Obstetrics and Gynecology

## 2016-04-17 DIAGNOSIS — Z1231 Encounter for screening mammogram for malignant neoplasm of breast: Secondary | ICD-10-CM

## 2016-06-03 ENCOUNTER — Ambulatory Visit
Admission: RE | Admit: 2016-06-03 | Discharge: 2016-06-03 | Disposition: A | Discharge status: Home / Self Care | Payer: 59 | Source: Ambulatory Visit | Attending: Obstetrics and Gynecology | Admitting: Obstetrics and Gynecology

## 2016-06-03 DIAGNOSIS — Z1231 Encounter for screening mammogram for malignant neoplasm of breast: Secondary | ICD-10-CM

## 2017-04-27 ENCOUNTER — Other Ambulatory Visit: Payer: Self-pay | Admitting: Obstetrics and Gynecology

## 2017-04-27 DIAGNOSIS — Z1231 Encounter for screening mammogram for malignant neoplasm of breast: Secondary | ICD-10-CM

## 2017-06-04 ENCOUNTER — Ambulatory Visit
Admission: RE | Admit: 2017-06-04 | Discharge: 2017-06-04 | Disposition: A | Discharge status: Home / Self Care | Payer: 59 | Source: Ambulatory Visit | Attending: Obstetrics and Gynecology | Admitting: Obstetrics and Gynecology

## 2017-06-04 DIAGNOSIS — Z1231 Encounter for screening mammogram for malignant neoplasm of breast: Secondary | ICD-10-CM

## 2018-11-09 ENCOUNTER — Other Ambulatory Visit: Payer: Self-pay | Admitting: Obstetrics and Gynecology

## 2018-11-09 DIAGNOSIS — Z1231 Encounter for screening mammogram for malignant neoplasm of breast: Secondary | ICD-10-CM

## 2018-12-29 ENCOUNTER — Other Ambulatory Visit: Payer: Self-pay

## 2018-12-29 ENCOUNTER — Ambulatory Visit
Admission: RE | Admit: 2018-12-29 | Discharge: 2018-12-29 | Disposition: A | Discharge status: Home / Self Care | Payer: 59 | Source: Ambulatory Visit | Attending: Obstetrics and Gynecology | Admitting: Obstetrics and Gynecology

## 2018-12-29 DIAGNOSIS — Z1231 Encounter for screening mammogram for malignant neoplasm of breast: Secondary | ICD-10-CM

## 2019-12-21 ENCOUNTER — Other Ambulatory Visit: Payer: Self-pay | Admitting: Obstetrics and Gynecology

## 2019-12-21 DIAGNOSIS — Z1231 Encounter for screening mammogram for malignant neoplasm of breast: Secondary | ICD-10-CM

## 2020-01-03 ENCOUNTER — Ambulatory Visit
Admission: RE | Admit: 2020-01-03 | Discharge: 2020-01-03 | Disposition: A | Discharge status: Home / Self Care | Payer: 59 | Source: Ambulatory Visit | Attending: Obstetrics and Gynecology | Admitting: Obstetrics and Gynecology

## 2020-01-03 ENCOUNTER — Other Ambulatory Visit: Payer: Self-pay

## 2020-01-03 DIAGNOSIS — Z1231 Encounter for screening mammogram for malignant neoplasm of breast: Secondary | ICD-10-CM

## 2020-12-17 ENCOUNTER — Other Ambulatory Visit: Payer: Self-pay | Admitting: Obstetrics and Gynecology

## 2020-12-17 DIAGNOSIS — Z1231 Encounter for screening mammogram for malignant neoplasm of breast: Secondary | ICD-10-CM

## 2021-01-17 ENCOUNTER — Ambulatory Visit
Admission: RE | Admit: 2021-01-17 | Discharge: 2021-01-17 | Disposition: A | Discharge status: Home / Self Care | Payer: 59 | Source: Ambulatory Visit | Attending: Obstetrics and Gynecology | Admitting: Obstetrics and Gynecology

## 2021-01-17 DIAGNOSIS — Z1231 Encounter for screening mammogram for malignant neoplasm of breast: Secondary | ICD-10-CM

## 2022-01-17 ENCOUNTER — Other Ambulatory Visit: Payer: Self-pay | Admitting: Obstetrics and Gynecology

## 2022-01-17 DIAGNOSIS — Z1231 Encounter for screening mammogram for malignant neoplasm of breast: Secondary | ICD-10-CM

## 2022-01-22 ENCOUNTER — Ambulatory Visit
Admission: RE | Admit: 2022-01-22 | Discharge: 2022-01-22 | Disposition: A | Discharge status: Home / Self Care | Payer: 59 | Source: Ambulatory Visit | Attending: Obstetrics and Gynecology | Admitting: Obstetrics and Gynecology

## 2022-01-22 DIAGNOSIS — Z1231 Encounter for screening mammogram for malignant neoplasm of breast: Secondary | ICD-10-CM

## 2023-01-19 ENCOUNTER — Other Ambulatory Visit: Payer: Self-pay | Admitting: Obstetrics and Gynecology

## 2023-01-19 DIAGNOSIS — Z1231 Encounter for screening mammogram for malignant neoplasm of breast: Secondary | ICD-10-CM

## 2023-02-03 ENCOUNTER — Ambulatory Visit
Admission: RE | Admit: 2023-02-03 | Discharge: 2023-02-03 | Disposition: A | Discharge status: Home / Self Care | Payer: BC Managed Care – PPO | Source: Ambulatory Visit | Attending: Obstetrics and Gynecology | Admitting: Obstetrics and Gynecology

## 2023-02-03 DIAGNOSIS — Z1231 Encounter for screening mammogram for malignant neoplasm of breast: Secondary | ICD-10-CM

## 2023-02-06 ENCOUNTER — Other Ambulatory Visit: Payer: Self-pay | Admitting: Obstetrics and Gynecology

## 2023-02-06 DIAGNOSIS — R928 Other abnormal and inconclusive findings on diagnostic imaging of breast: Secondary | ICD-10-CM

## 2023-02-23 ENCOUNTER — Other Ambulatory Visit: Payer: Self-pay | Admitting: Obstetrics and Gynecology

## 2023-02-23 ENCOUNTER — Ambulatory Visit
Admission: RE | Admit: 2023-02-23 | Discharge: 2023-02-23 | Disposition: A | Discharge status: Home / Self Care | Payer: BC Managed Care – PPO | Source: Ambulatory Visit | Attending: Obstetrics and Gynecology | Admitting: Obstetrics and Gynecology

## 2023-02-23 DIAGNOSIS — R928 Other abnormal and inconclusive findings on diagnostic imaging of breast: Secondary | ICD-10-CM

## 2023-02-26 ENCOUNTER — Ambulatory Visit
Admission: RE | Admit: 2023-02-26 | Discharge: 2023-02-26 | Disposition: A | Discharge status: Home / Self Care | Payer: BC Managed Care – PPO | Source: Ambulatory Visit | Attending: Obstetrics and Gynecology | Admitting: Obstetrics and Gynecology

## 2023-02-26 DIAGNOSIS — R928 Other abnormal and inconclusive findings on diagnostic imaging of breast: Secondary | ICD-10-CM

## 2023-02-26 HISTORY — PX: BREAST BIOPSY: SHX20

## 2023-02-27 LAB — SURGICAL PATHOLOGY

## 2023-06-15 IMAGING — MG MM DIGITAL SCREENING BILAT W/ TOMO AND CAD
8 series · 9 of 24 positions shown · non-contrast
Comparison: Previous exam(s).

CLINICAL DATA: Screening.

EXAM:
DIGITAL SCREENING BILATERAL MAMMOGRAM WITH TOMOSYNTHESIS AND CAD
TECHNIQUE: Bilateral screening digital craniocaudal and mediolateral oblique
mammograms were obtained. Bilateral screening digital breast
tomosynthesis was performed. The images were evaluated with
computer-aided detection.

[L MLO synth-2D]
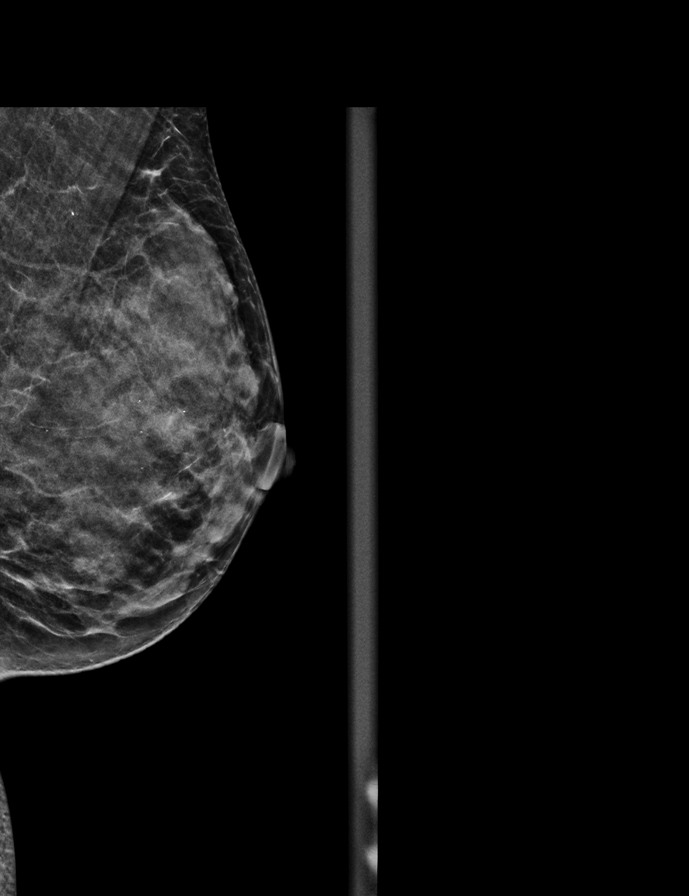

[R MLO synth-2D]
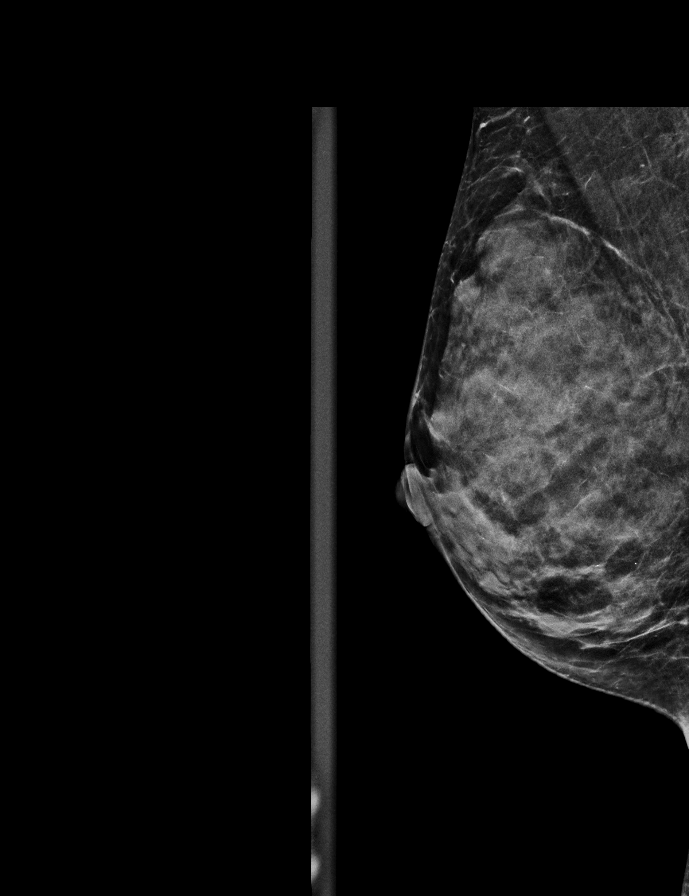

[R CC synth-2D]
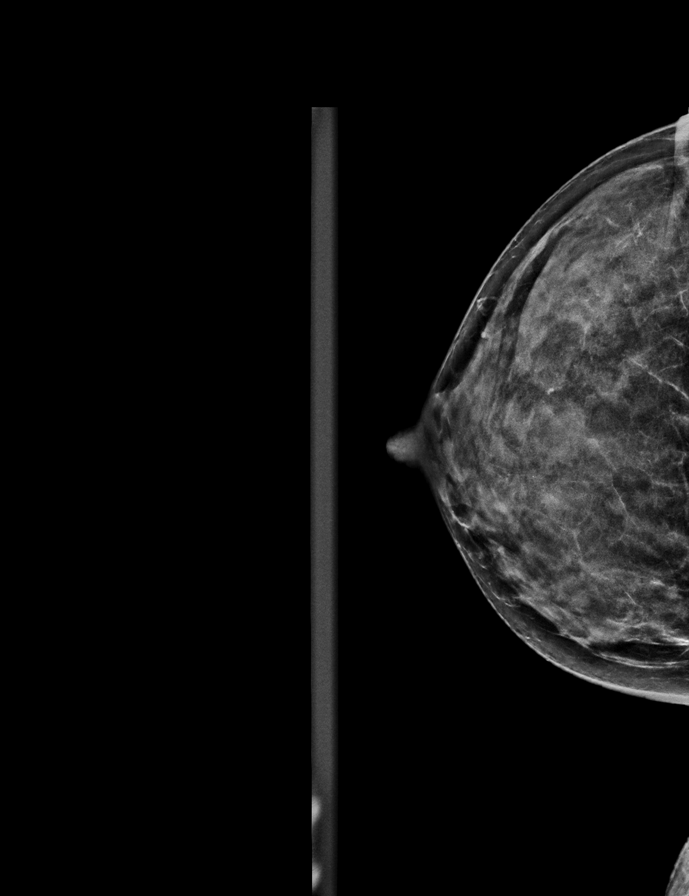

[L CC synth-2D]
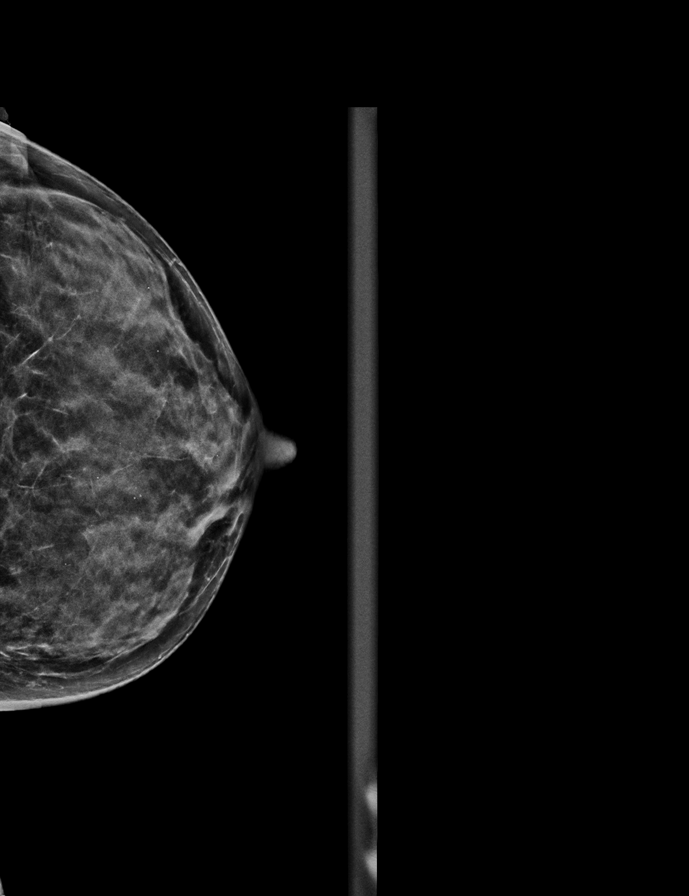

[R MLO tomo · 2 of 45 frames shown]
[frame 15/45]
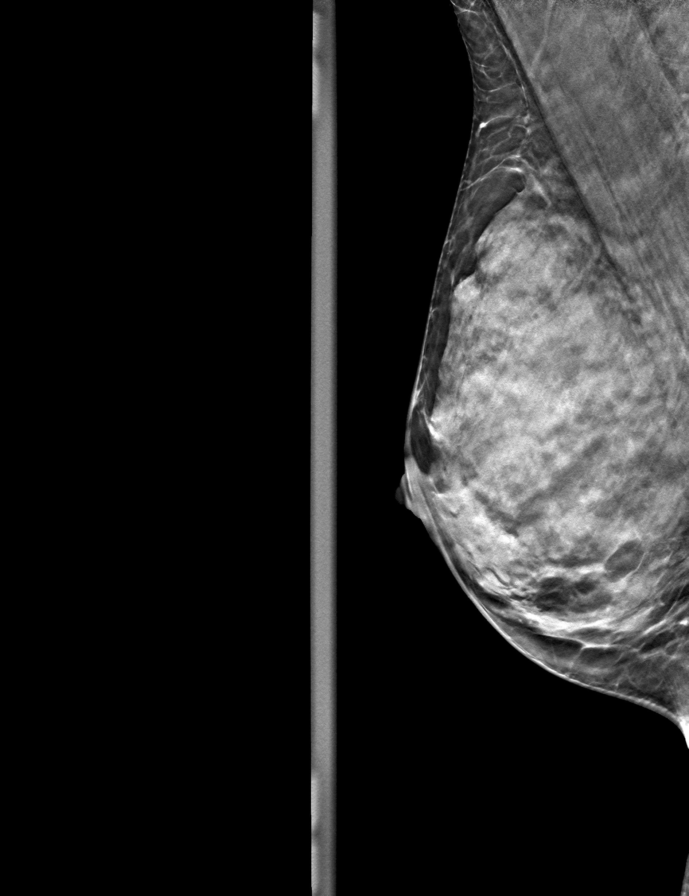
[frame 23/45]
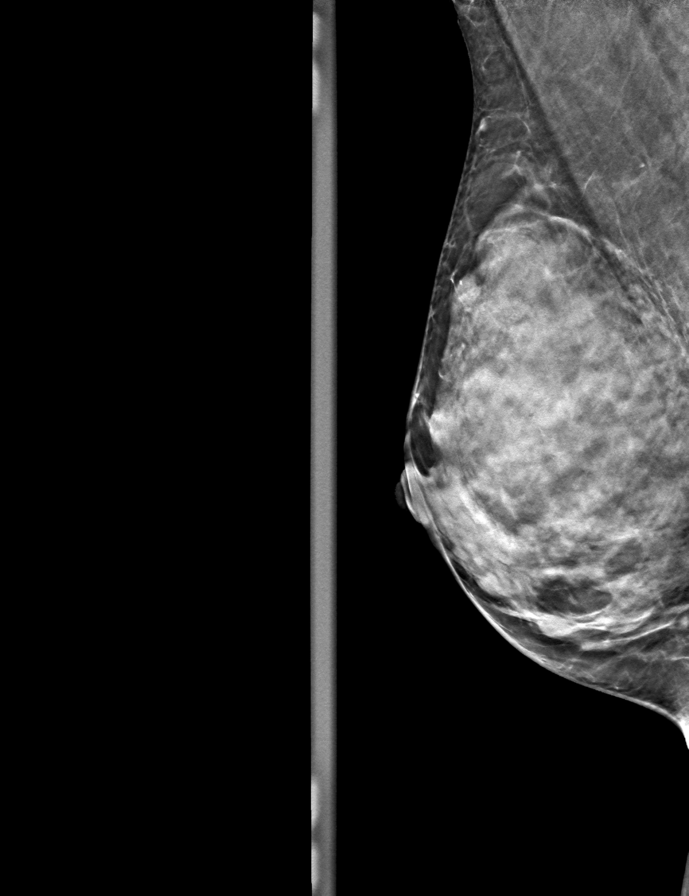

[L CC tomo · tomo slice 22/43.0]
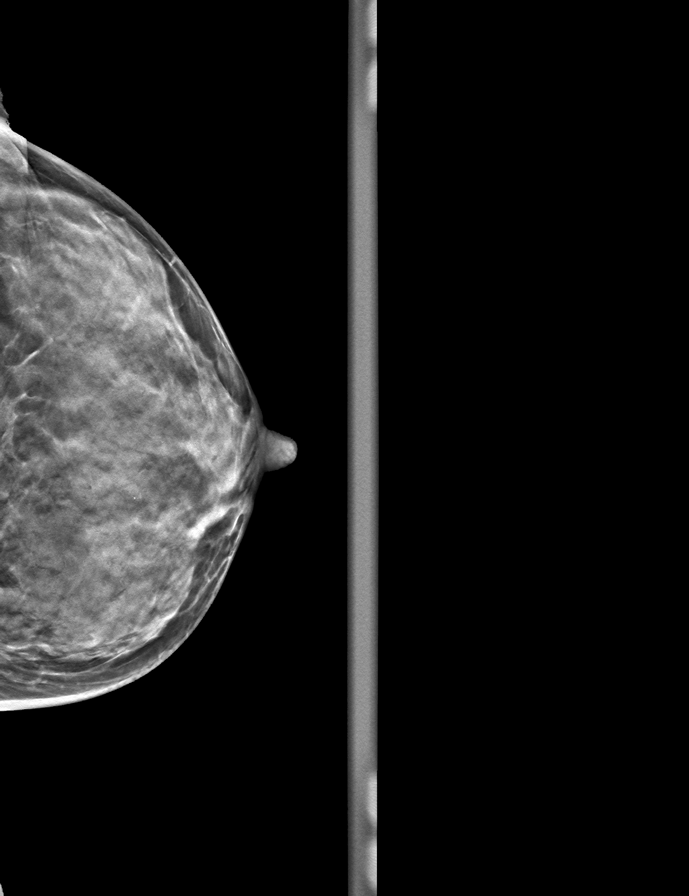

[R CC tomo · tomo slice 25/48.0]
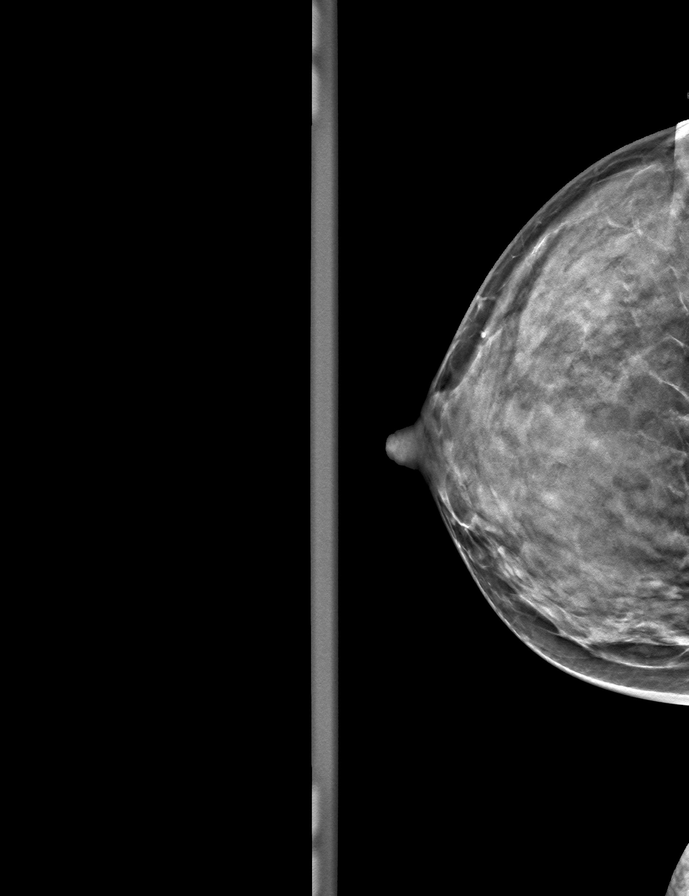

[L MLO tomo · tomo slice 21/41.0]
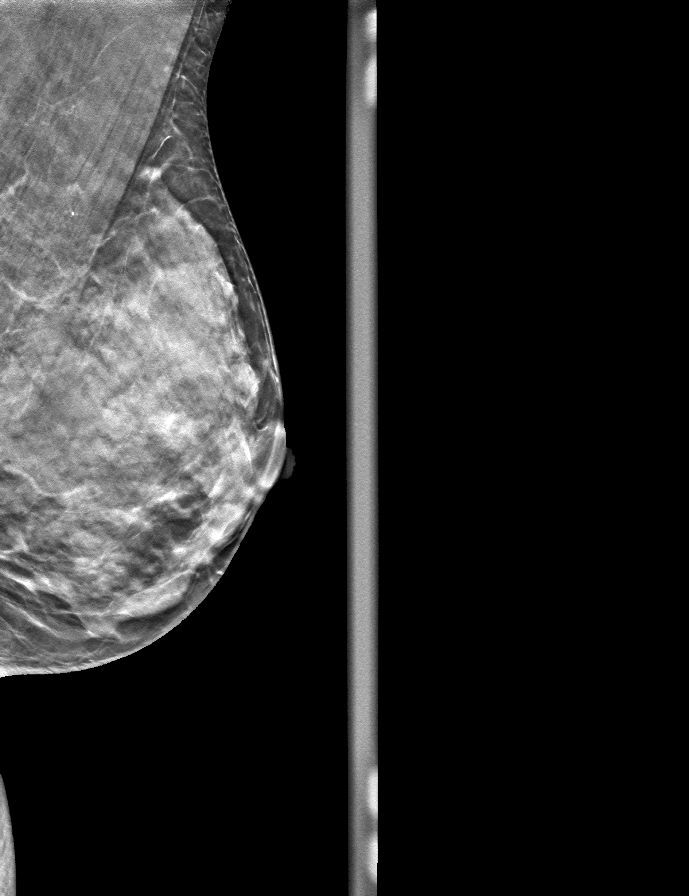

[9 of 24 positions shown; findings below may reference images not displayed]

ACR Breast Density Category d: The breast tissue is extremely dense,
which lowers the sensitivity of mammography
FINDINGS: There are no findings suspicious for malignancy.
IMPRESSION: No mammographic evidence of malignancy. A result letter of this
screening mammogram will be mailed directly to the patient.

RECOMMENDATION:
Screening mammogram in one year. (Code:TA-V-WV9)

BI-RADS CATEGORY  1: Negative.

## 2023-09-14 ENCOUNTER — Other Ambulatory Visit: Payer: Self-pay | Admitting: Neurosurgery

## 2023-09-14 ENCOUNTER — Other Ambulatory Visit: Payer: Self-pay | Admitting: Nurse Practitioner

## 2023-09-14 ENCOUNTER — Encounter: Payer: Self-pay | Admitting: Nurse Practitioner

## 2023-09-14 DIAGNOSIS — N6091 Unspecified benign mammary dysplasia of right breast: Secondary | ICD-10-CM

## 2023-10-01 ENCOUNTER — Ambulatory Visit

## 2023-10-01 ENCOUNTER — Ambulatory Visit
Admission: RE | Admit: 2023-10-01 | Discharge: 2023-10-01 | Disposition: A | Discharge status: Home / Self Care | Source: Ambulatory Visit | Attending: Nurse Practitioner | Admitting: Nurse Practitioner

## 2023-10-01 DIAGNOSIS — N6091 Unspecified benign mammary dysplasia of right breast: Secondary | ICD-10-CM

## 2024-01-19 ENCOUNTER — Other Ambulatory Visit: Payer: Self-pay | Admitting: Nurse Practitioner

## 2024-01-19 DIAGNOSIS — N6091 Unspecified benign mammary dysplasia of right breast: Secondary | ICD-10-CM
